# Patient Record
Sex: Male | Born: 1990 | Race: White | Hispanic: No | Marital: Single | Smoking: Former smoker
Health system: Southern US, Community
[De-identification: ages and names within clinical notes are randomized; demographics above are authoritative.]

## PROBLEM LIST (undated history)

## (undated) DIAGNOSIS — J45909 Unspecified asthma, uncomplicated: Secondary | ICD-10-CM

## (undated) DIAGNOSIS — F988 Other specified behavioral and emotional disorders with onset usually occurring in childhood and adolescence: Secondary | ICD-10-CM

## (undated) HISTORY — PX: TONSILLECTOMY: SUR1361

## (undated) HISTORY — DX: Other specified behavioral and emotional disorders with onset usually occurring in childhood and adolescence: F98.8

## (undated) HISTORY — DX: Unspecified asthma, uncomplicated: J45.909

---
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
## 2009-08-11 HISTORY — PX: WISDOM TOOTH EXTRACTION: SHX21

## 2013-01-05 LAB — CBC AND DIFFERENTIAL
HEMATOCRIT: 43 % (ref 41–53)
Hemoglobin: 14.8 g/dL (ref 13.5–17.5)
Neutrophils Absolute: 2 /uL
Platelets: 187 10*3/uL (ref 150–399)
WBC: 4.5 10^3/mL

## 2013-01-05 LAB — HEPATIC FUNCTION PANEL
ALT: 26 U/L (ref 10–40)
AST: 19 U/L (ref 14–40)
Alkaline Phosphatase: 38 U/L (ref 25–125)
BILIRUBIN, TOTAL: 0.6 mg/dL

## 2013-01-05 LAB — BASIC METABOLIC PANEL
BUN: 18 mg/dL (ref 4–21)
Creatinine: 1 mg/dL (ref 0.6–1.3)
Glucose: 94 mg/dL
POTASSIUM: 5 mmol/L (ref 3.4–5.3)
Sodium: 141 mmol/L (ref 137–147)

## 2013-01-05 LAB — LIPID PANEL
Cholesterol: 155 mg/dL (ref 0–200)
HDL: 45 mg/dL (ref 35–70)
LDL Cholesterol: 98 mg/dL
LDl/HDL Ratio: 2.2
Triglycerides: 61 mg/dL (ref 40–160)

## 2013-12-22 ENCOUNTER — Ambulatory Visit: Payer: Self-pay | Admitting: Otolaryngology

## 2014-07-04 ENCOUNTER — Ambulatory Visit: Payer: Self-pay | Admitting: Family Medicine

## 2014-07-04 ENCOUNTER — Observation Stay: Payer: Self-pay | Admitting: General Surgery

## 2014-07-04 DIAGNOSIS — K3580 Unspecified acute appendicitis: Secondary | ICD-10-CM

## 2014-07-04 HISTORY — PX: APPENDECTOMY: SHX54

## 2014-07-04 LAB — CBC WITH DIFFERENTIAL/PLATELET
BASOS ABS: 0 10*3/uL (ref 0.0–0.1)
Basophil %: 0.4 %
EOS PCT: 3.6 %
Eosinophil #: 0.3 10*3/uL (ref 0.0–0.7)
HCT: 43.2 % (ref 40.0–52.0)
HGB: 14.6 g/dL (ref 13.0–18.0)
LYMPHS ABS: 2.3 10*3/uL (ref 1.0–3.6)
LYMPHS PCT: 31.1 %
MCH: 30.4 pg (ref 26.0–34.0)
MCHC: 33.7 g/dL (ref 32.0–36.0)
MCV: 90 fL (ref 80–100)
MONO ABS: 0.6 x10 3/mm (ref 0.2–1.0)
MONOS PCT: 7.8 %
NEUTROS ABS: 4.3 10*3/uL (ref 1.4–6.5)
NEUTROS PCT: 57.1 %
Platelet: 171 10*3/uL (ref 150–440)
RBC: 4.79 10*6/uL (ref 4.40–5.90)
RDW: 12.6 % (ref 11.5–14.5)
WBC: 7.5 10*3/uL (ref 3.8–10.6)

## 2014-07-05 ENCOUNTER — Encounter: Payer: Self-pay | Admitting: General Surgery

## 2014-07-05 LAB — CBC WITH DIFFERENTIAL/PLATELET
BASOS ABS: 0 10*3/uL (ref 0.0–0.1)
Basophil %: 0.2 %
EOS ABS: 0.1 10*3/uL (ref 0.0–0.7)
Eosinophil %: 0.9 %
HCT: 40 % (ref 40.0–52.0)
HGB: 13.1 g/dL (ref 13.0–18.0)
LYMPHS PCT: 10.5 %
Lymphocyte #: 1.5 10*3/uL (ref 1.0–3.6)
MCH: 30 pg (ref 26.0–34.0)
MCHC: 32.8 g/dL (ref 32.0–36.0)
MCV: 92 fL (ref 80–100)
MONO ABS: 1.1 x10 3/mm — AB (ref 0.2–1.0)
Monocyte %: 7.9 %
NEUTROS ABS: 11.8 10*3/uL — AB (ref 1.4–6.5)
NEUTROS PCT: 80.5 %
PLATELETS: 154 10*3/uL (ref 150–440)
RBC: 4.37 10*6/uL — ABNORMAL LOW (ref 4.40–5.90)
RDW: 12.4 % (ref 11.5–14.5)
WBC: 14.6 10*3/uL — ABNORMAL HIGH (ref 3.8–10.6)

## 2014-07-10 ENCOUNTER — Encounter: Payer: Self-pay | Admitting: General Surgery

## 2014-07-18 ENCOUNTER — Ambulatory Visit: Payer: Managed Care, Other (non HMO) | Admitting: General Surgery

## 2014-07-20 ENCOUNTER — Ambulatory Visit (INDEPENDENT_AMBULATORY_CARE_PROVIDER_SITE_OTHER): Payer: Self-pay | Admitting: General Surgery

## 2014-07-20 ENCOUNTER — Ambulatory Visit: Payer: Managed Care, Other (non HMO) | Admitting: General Surgery

## 2014-07-20 ENCOUNTER — Encounter: Payer: Self-pay | Admitting: General Surgery

## 2014-07-20 VITALS — BP 140/80 | HR 90 | Resp 16 | Ht 70.0 in | Wt 203.0 lb

## 2014-07-20 DIAGNOSIS — K3580 Unspecified acute appendicitis: Secondary | ICD-10-CM

## 2014-07-20 NOTE — Patient Instructions (Signed)
Gradually increase activity

## 2014-07-20 NOTE — Progress Notes (Signed)
Here today for postoperative viist, appendectomy done 07-05-14. He states he is doing well. Bowels are  regular.   Port sites well healed. Abdomen is soft, non tender and positive bowel sounds. Gradually increase activity. Follow up as needed. Path showed acute appendicitis. Pt advised.

## 2014-12-02 NOTE — Op Note (Signed)
PATIENT NAME:  Nathan Johnston, Nathan Johnston MR#:  [id number] DATE OF BIRTH:  1990/11/19  DATE OF PROCEDURE:  07/05/2014  PREOPERATIVE DIAGNOSIS: Acute appendicitis.   POSTOPERATIVE DIAGNOSIS: Acute appendicitis.   OPERATION: Laparoscopy and appendectomy.   SURGEON: Kathreen CosierS.  G. Juandavid Dallman, MD   ANESTHESIA: General.   COMPLICATIONS: None.   ESTIMATED BLOOD LOSS: Minimal.   DRAINS: None.   PROCEDURE IN DETAIL: The patient was put to sleep in the supine position on the operating table. Foley catheter was inserted, and then removed at the end of the procedure. The initial entry was made at the umbilicus, along the upper lip. The Veress needle with the InnerDyne sleeve was positioned after a small stab incision was made, and positioned in the peritoneal cavity and verified with the hanging drop method. Pneumoperitoneum was obtained and a 10 mm port was placed. The camera was then introduced. A suprapubic 5 mm port in the left lower quadrant and 12 mm ports were placed. The appendix was lying just above the right lower quadrant area, pointing directly down, and noted to be mildly inflamed at its distal 2 cm. The mesoappendix was identified, freed close to the appendix, and taken down with the white load of an Endo GIA. It was then noted that the patient had somewhat of a bulbous appendiceal base, containing what looked like a lipoma-like, soft mass, at this site. It was, therefore, decided to include this portion of the cecal wall adjacent to the appendix, and therefore a longer GIA was used and the blue load was applied. this portion was stapled across and cut. The appendix was removed with the retrieval bag through the left lower quadrant port site. The staple line was inspected. Two tiny oozing spots were cauterized. A small amount of fluid was used to irrigate out the right lower abdomen and the pelvic area, and suctioned out. A small serosal tear was noted on the bowel, adjacent to this, from the suction, in order to  ensure that this would not cause a leak, and this was lifted up and a smaller GIA stapled across to close this. After ensuring hemostasis, the 2 fascial openings in the left lower quadrant and the umbilicus respectively, were closed with the use of 0 Vicryl placed with suture passers, pneumoperitoneum was released and the remaining port removed. The skin incisions were closed with subcuticular 4-0 Vicryl, covered with LiquiBand.   The procedure was well tolerated. He was subsequently extubated and returned to the recovery room in stable condition.    ____________________________ S.Wynona LunaG. Kalijah Zeiss, MD sgs:MT D: 07/05/2014 10:53:41 ET T: 07/05/2014 12:32:03 ET JOB#: [id number]  cc: S.G. Evette CristalSankar, MD, <Dictator> [facility] Wynona LunaG Sherrol Vicars MD ELECTRONICALLY SIGNED 07/05/2014 19:28

## 2014-12-02 NOTE — H&P (Signed)
Subjective/Chief Complaint abdominal pain   History of Present Illness Healthy 24 yr old male started having some pain in right lower abdomen 2 days ago. It has persisted and he decided to seek attention today.  No n/v, bowel change, urinary symptoms or fever.   Past History Tnsillectomy,  ADD   Past Med/Surgical Hx:  denies medical:   Tonsillectomy:   ALLERGIES:  No Known Allergies:   Family and Social History:  Family History Non-Contributory   Review of Systems:  Fever/Chills No   Abdominal Pain Yes  right lower abdomen   Constipation No   Nausea/Vomiting No   Dysuria No   Physical Exam:  GEN well developed, well nourished, no acute distress   HEENT pink conjunctivae   NECK supple  No masses   RESP normal resp effort  clear BS   CARD regular rate  no murmur   ABD positive tenderness  soft  normal BS  modertae rlq tenderness with mild rebound tenderness.   LYMPH negative neck   SKIN No rashes   PSYCH A+O to time, place, person   Radiology Results: CT:    24-Nov-15 18:49, CT Abdomen and Pelvis With Contrast  CT Abdomen and Pelvis With Contrast  REASON FOR EXAM:    CALL REPORT [phone] RLQ Pain and tenderness   possible appendicitis HOLD ...  COMMENTS:       PROCEDURE: CT  - CT ABDOMEN / PELVIS  W  - Jul 04 2014  6:49PM     CLINICAL DATA:  Right lower quadrant pain for 48 hours. No nausea,  vomiting or diarrhea. Fever.    EXAM:  CT ABDOMEN AND PELVIS WITH CONTRAST    TECHNIQUE:  Multidetector CT imaging of the abdomen and pelvis was performed  using the standard protocol following bolus administration of  intravenous contrast.    CONTRAST:  100 mL Isovue 370    COMPARISON:  None.    FINDINGS:  The lung bases are clear.    The liver demonstrates no focal abnormality. There is no  intrahepatic or extrahepatic biliary ductal dilatation. The  gallbladder is normal. The spleen demonstrates no focal abnormality.  The kidneys, adrenal  glands and pancreas are normal. The bladder is  unremarkable.  The stomach, duodenum, small intestine, and large intestine  demonstrate no contrast extravasation or dilatation. The appendix is  dilatedmeasuring 11 mm in diameter with periappendiceal  inflammatory changes and an afferent the colon at the base of the  appendix. There is no pneumoperitoneum, pneumatosis, or portal  venous gas. There is no abdominal or pelvic free fluid. There is no  lymphadenopathy.    The abdominal aorta is normal in caliber .    There are no lytic or sclerotic osseous lesions.     IMPRESSION:  1. Findings consistent with acute appendicitis. No evidence of  appendiceal perforation or periappendiceal abscess.  These results were called by telephone at the time of interpretation  on 07/04/2014 at 7:01 pm to Dr. Julieanne Manson , who verbally  acknowledged these results.      Electronically Signed    By: Elige Ko    On: 07/04/2014 19:01         Verified UJ:[id number] Leodis Binet, M.D., MD    Assessment/Admission Diagnosis CT reviewed. Dialted appendix with stranding consistent with acute appendicitis.   Plan Recommended laparoscopy, appendectiomy. Reasons, procedure, risks and benfits explained and he is agreeable.   Electronic Signatures: Kieth Brightly (MD)  (Signed [phone]  19:56)  Authored: CHIEF COMPLAINT and HISTORY, PAST MEDICAL/SURGIAL HISTORY, ALLERGIES, FAMILY AND SOCIAL HISTORY, REVIEW OF SYSTEMS, PHYSICAL EXAM, Radiology, ASSESSMENT AND PLAN   Last Updated: 24-Nov-15 19:56 by Kieth BrightlySankar, Seeplaputhur G (MD)

## 2014-12-04 LAB — SURGICAL PATHOLOGY

## 2015-02-17 DIAGNOSIS — F988 Other specified behavioral and emotional disorders with onset usually occurring in childhood and adolescence: Secondary | ICD-10-CM | POA: Insufficient documentation

## 2015-02-17 DIAGNOSIS — E663 Overweight: Secondary | ICD-10-CM | POA: Insufficient documentation

## 2015-02-17 DIAGNOSIS — J45909 Unspecified asthma, uncomplicated: Secondary | ICD-10-CM | POA: Insufficient documentation

## 2015-02-19 ENCOUNTER — Ambulatory Visit (INDEPENDENT_AMBULATORY_CARE_PROVIDER_SITE_OTHER): Payer: Managed Care, Other (non HMO) | Admitting: Family Medicine

## 2015-02-19 ENCOUNTER — Encounter: Payer: Self-pay | Admitting: Family Medicine

## 2015-02-19 VITALS — BP 124/78 | HR 76 | Temp 98.2°F | Resp 16 | Ht 70.0 in | Wt 218.0 lb

## 2015-02-19 DIAGNOSIS — F909 Attention-deficit hyperactivity disorder, unspecified type: Secondary | ICD-10-CM

## 2015-02-19 DIAGNOSIS — Z111 Encounter for screening for respiratory tuberculosis: Secondary | ICD-10-CM

## 2015-02-19 DIAGNOSIS — F988 Other specified behavioral and emotional disorders with onset usually occurring in childhood and adolescence: Secondary | ICD-10-CM

## 2015-02-19 MED ORDER — METHYLPHENIDATE HCL ER (LA) 40 MG PO CP24: 40.0000 mg | ORAL_CAPSULE | ORAL | Status: DC

## 2015-02-19 NOTE — Progress Notes (Signed)
Patient ID: Nathan Johnston, male   DOB: 1991-02-14, 24 y.o.   MRN: [id number]   Nathan Johnston  MRN: [id number] DOB: [birth year]  Subjective:  HPI   1. ADD (attention deficit disorder) Patient presents today for follow up of his ADD.  He was last seen on 11/27/14.  No management changes were made at that time.  He reports good compliance and good tolerance of the medications.  He is currently taking Concerta 54 mg daily and then Ritalin 5 mg as needed for days that he has late hours.  2. 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  It may require him to have a TB screening done.   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 He has finished his math masters and starts his  new job this month.    Patient Active Problem List   Diagnosis Date Noted  . ADD (attention deficit disorder) 02/17/2015  . Asthma 02/17/2015  . Overweight 02/17/2015    Past Medical History  Diagnosis Date  . ADD (attention deficit disorder)   . Asthma   . ADD (attention deficit disorder)     History   Social History  . Marital Status: Single    Spouse Name: N/A  . Number of Children: N/A  . Years of Education: N/A   Occupational History  . Not on file.   Social History Main Topics  . Smoking status: Former Smoker -- 0.25 packs/day for 2 years    Types: Cigarettes    Quit date: 08/10/2013  . Smokeless tobacco: Never Used  . Alcohol Use: 0.0 oz/week    0 Standard drinks or equivalent per week     Comment: 2-3 beers per week  . Drug Use: No  . Sexual Activity:    Partners: Male   Other Topics Concern  . Not on file   Social History Narrative    Outpatient Prescriptions Prior to Visit  Medication Sig Dispense Refill  . acetaminophen (TYLENOL) 500 MG tablet Take 500 mg by mouth every 6 (six) hours as  needed.    . Ascorbic Acid (VITAMIN C) 100 MG tablet Take 100 mg by mouth daily.    . B Complex Vitamins (B-COMPLEX/B-12 PO) Take by mouth daily.     . fluticasone (FLONASE) 50 MCG/ACT nasal spray Place 2 sprays into both nostrils daily.   12  . methylphenidate (RITALIN) 5 MG tablet Take 5 mg by mouth daily as needed.    . METHYLPHENIDATE 54 MG PO CR tablet Take 54 mg by mouth every morning.     . Multiple Vitamin (MULTIVITAMIN) capsule Take 1 capsule by mouth daily.    . vitamin E 100 UNIT capsule Take by mouth daily.     No facility-administered medications prior to visit.    No Known Allergies  Review of Systems  Constitutional: Negative.   Respiratory: Negative.   Cardiovascular: Negative.   Neurological: Negative.   Psychiatric/Behavioral: Negative.    Objective:  BP 124/78 mmHg  Pulse 76  Temp(Src) 98.2 F (36.8 C) (Oral)  Resp 16  Ht  (1.778 m)  Wt 218 lb (98.884 kg)  BMI 31.28 kg/m2  Physical Exam  Constitutional: He is oriented to person, place, and time and well-developed, well-nourished, and in no distress.  HENT:  Head: Normocephalic and atraumatic.  Right Ear: External ear normal.  Left Ear: External ear normal.  Nose: Nose normal.  Mouth/Throat: Oropharynx is clear and moist.  Eyes: Conjunctivae and EOM are normal. Pupils are equal, round, and reactive to light.  Cardiovascular: Normal rate, regular rhythm and normal heart sounds.   Pulmonary/Chest: Effort normal and breath sounds normal.  Abdominal: Soft. Bowel sounds are normal.  Musculoskeletal: Normal range of motion.  Neurological: He is alert and oriented to person, place, and time. Gait normal.  Skin: Skin is warm and dry.  Psychiatric: Memory, affect and judgment normal.    Assessment and Plan :   1. ADD (attention deficit disorder) Patient is stable on present medications will continue the same.  He reports he is only in need of the refill on his Concerta. Brand-name Concerta is not  covered so will do extended release Ritalin at the highest dose as his matches the dose of the Concerta. He will [place] new [place]. I have given them in the neck line group there [place] that of known for many many years. - methylphenidate (RITALIN LA) 40 MG 24 hr capsule; Take 1 capsule (40 mg total) by mouth every morning.  Dispense: 30 capsule; Refill: 0 - methylphenidate (RITALIN LA) 40 MG 24 hr capsule; Take 1 capsule (40 mg total) by mouth every morning.  Dispense: 30 capsule; Refill: 0 - methylphenidate (RITALIN LA) 40 MG 24 hr capsule; Take 1 capsule (40 mg total) by mouth every morning.  Dispense: 30 capsule; Refill: 0  2. Screening-pulmonary TB  Patient may need TB screening test done for his new employment.  Will provide order for test in the event he needs this.  - Quantiferon tb gold assay 3. 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 Julieanne Mansonichard Gilbert MD [facility] 02/19/2015 4:12 PM

## 2015-02-21 ENCOUNTER — Telehealth: Payer: Self-pay | Admitting: Family Medicine

## 2015-02-21 NOTE — Telephone Encounter (Signed)
LMTCB ED 

## 2015-02-21 NOTE — Telephone Encounter (Signed)
Pt's father have questions about his concerta.  Please call him back.

## 2015-02-22 ENCOUNTER — Other Ambulatory Visit: Payer: Self-pay

## 2015-02-22 MED ORDER — METHYLPHENIDATE HCL ER (OSM) 54 MG PO TBCR: 54.0000 mg | EXTENDED_RELEASE_TABLET | ORAL | Status: DC

## 2015-02-22 MED ORDER — METHYLPHENIDATE HCL ER (OSM) 54 MG PO TBCR: 54.0000 mg | EXTENDED_RELEASE_TABLET | ORAL | Status: AC

## 2015-02-22 NOTE — Telephone Encounter (Signed)
Please call him at work number.

## 2015-02-22 NOTE — Telephone Encounter (Signed)
LMtcb for father of patient Renae Fickleaul at [phone]

## 2015-02-23 NOTE — Telephone Encounter (Signed)
Rx has been changed  ED

## 2015-05-22 ENCOUNTER — Telehealth: Payer: Self-pay | Admitting: Family Medicine

## 2015-05-22 NOTE — Telephone Encounter (Signed)
Any suggestions for below?

## 2015-05-22 NOTE — Telephone Encounter (Signed)
Large FP group used to be there--[place] are some of the names--good doctors.

## 2015-05-22 NOTE — Telephone Encounter (Signed)
LMTCB

## 2015-05-22 NOTE — Telephone Encounter (Signed)
Pt states he has just moved to [place], [place] and is requesting a referral for a new PCP if Dr Sullivan Lone can suggest a new doctor in the [place] area.  [phone]

## 2015-05-22 NOTE — Telephone Encounter (Signed)
Pt states he has just moved to [place], [place] and is asking if Dr Sullivan Lone can suggest a PCP doctor in the [place] area.  ON#[phone]

## 2015-05-23 NOTE — Telephone Encounter (Signed)
Pt advised/MW °

## 2015-06-01 ENCOUNTER — Telehealth: Payer: Self-pay

## 2015-06-01 ENCOUNTER — Other Ambulatory Visit: Payer: Self-pay | Admitting: Family Medicine

## 2015-06-01 MED ORDER — METHYLPHENIDATE HCL ER (OSM) 54 MG PO TBCR: 54.0000 mg | EXTENDED_RELEASE_TABLET | ORAL | Status: DC

## 2015-06-01 MED ORDER — METHYLPHENIDATE HCL ER (OSM) 54 MG PO TBCR: 54.0000 mg | EXTENDED_RELEASE_TABLET | ORAL | Status: AC

## 2015-06-01 NOTE — Telephone Encounter (Signed)
[place] and is trying to get set up with new PCP but will run out of concerta before he gets an appointment, Can he get refill on this for 2 months,. This is dr. Elisabeth CaraGilbert's patient please review-aa

## 2015-06-06 ENCOUNTER — Other Ambulatory Visit: Payer: Self-pay | Admitting: Family Medicine

## 2015-06-06 MED ORDER — METHYLPHENIDATE HCL ER (OSM) 54 MG PO TBCR: 54.0000 mg | EXTENDED_RELEASE_TABLET | ORAL | Status: DC

## 2015-06-06 MED ORDER — METHYLPHENIDATE HCL ER (OSM) 54 MG PO TBCR: 54.0000 mg | EXTENDED_RELEASE_TABLET | ORAL | Status: AC

## 2015-09-11 IMAGING — CT CT ABD-PELV W/ CM
2 of 4 series · 16 of 46 positions shown, 18 images · IV contrast (isovue)
Comparison: None.

CLINICAL DATA: Right lower quadrant pain for 48 hours. No nausea,
vomiting or diarrhea. Fever.

EXAM:
CT ABDOMEN AND PELVIS WITH CONTRAST
TECHNIQUE: Multidetector CT imaging of the abdomen and pelvis was performed
using the standard protocol following bolus administration of
intravenous contrast.
CONTRAST:  100 mL Isovue 370

[Series 2: routine abd pel with · axial · 0.77mm/px · z∈[-941,-486]mm · 13 of 99 slices shown, 15 images]
[im 4/99  soft-tissue]
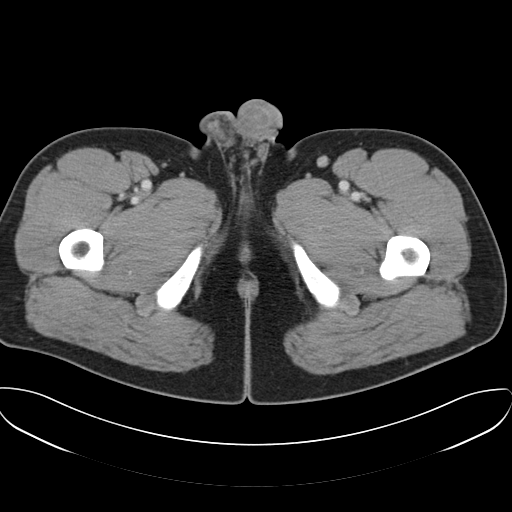
[im 4/99  bone]
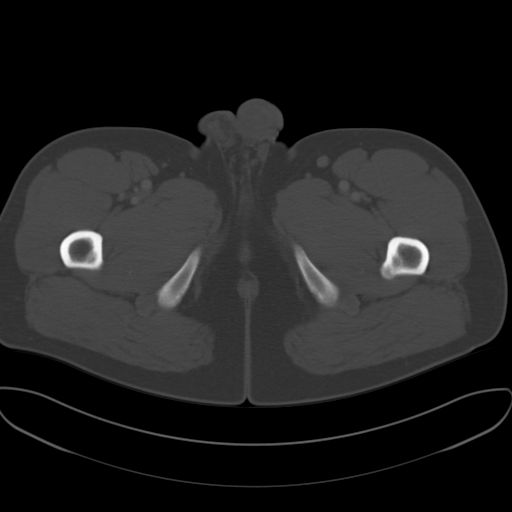
[im 12/99  soft-tissue]
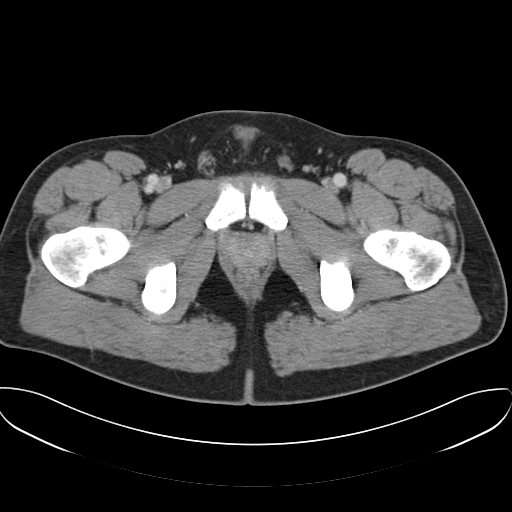
[im 20/99  soft-tissue]
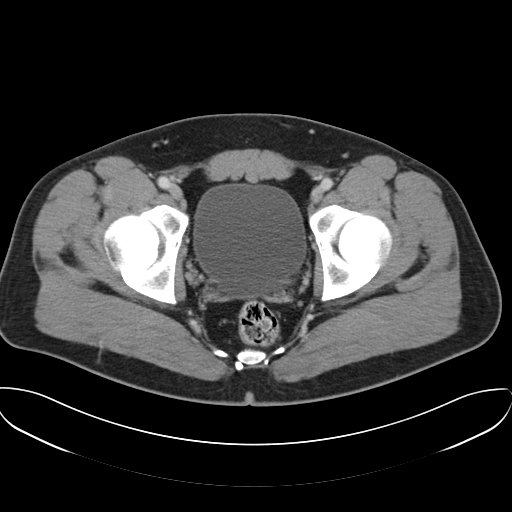
[im 28/99  soft-tissue]
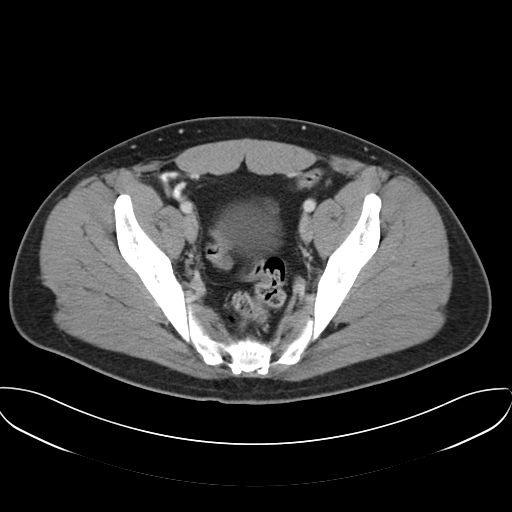
[im 36/99  soft-tissue]
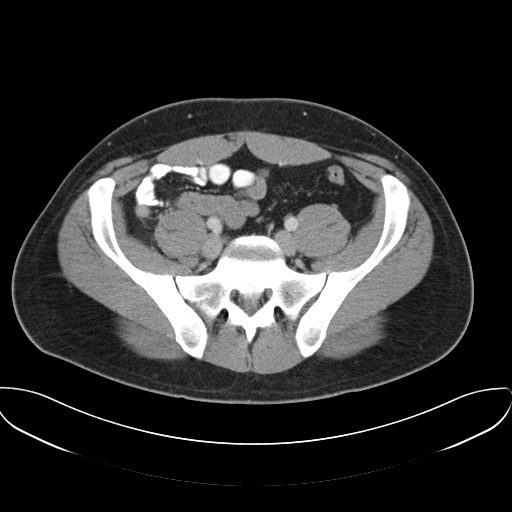
[im 44/99  soft-tissue]
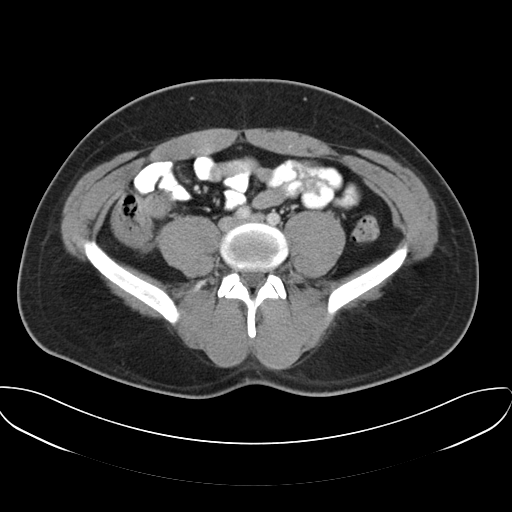
[im 51/99  soft-tissue]
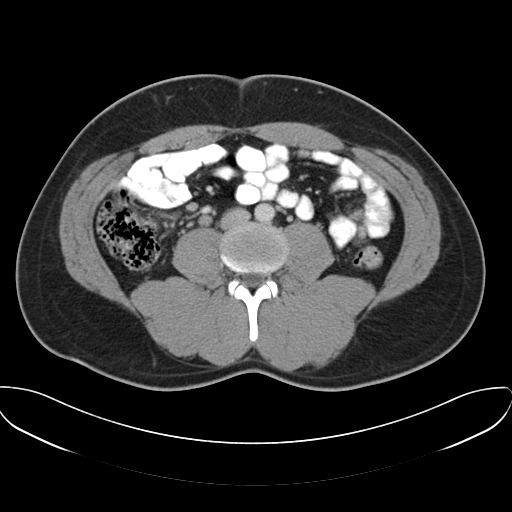
[im 55/99  soft-tissue]
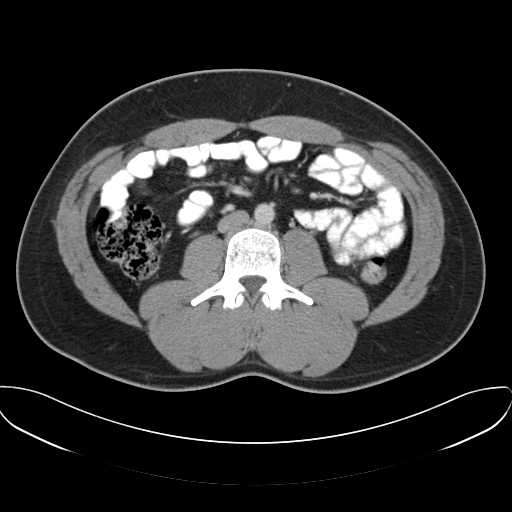
[im 63/99  soft-tissue]
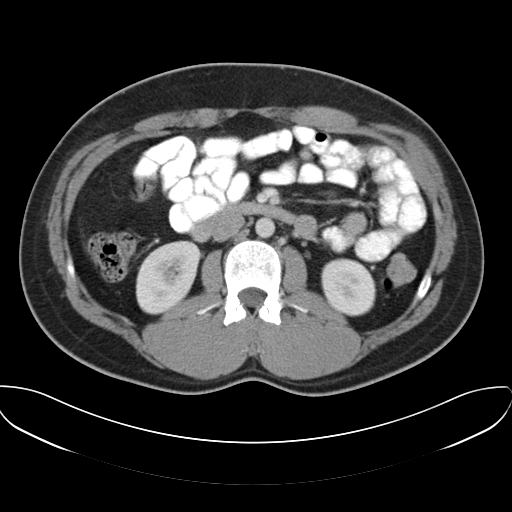
[im 63/99  bone]
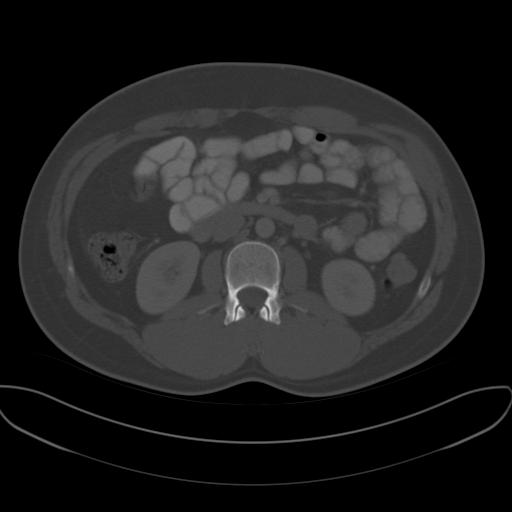
[im 71/99  soft-tissue]
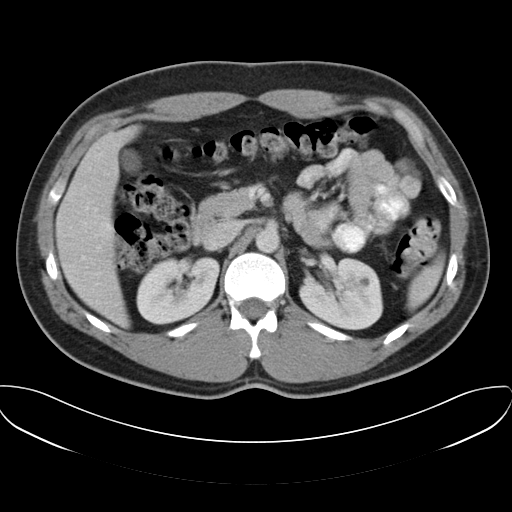
[im 79/99  soft-tissue]
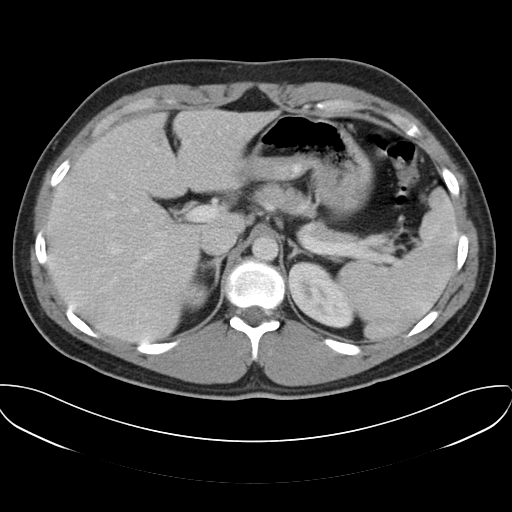
[im 87/99  soft-tissue]
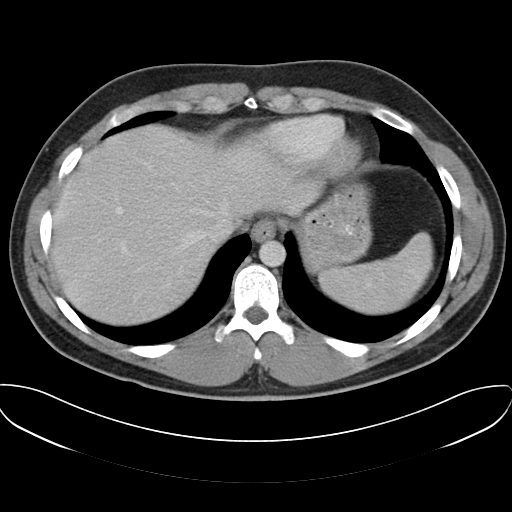
[im 95/99  soft-tissue]
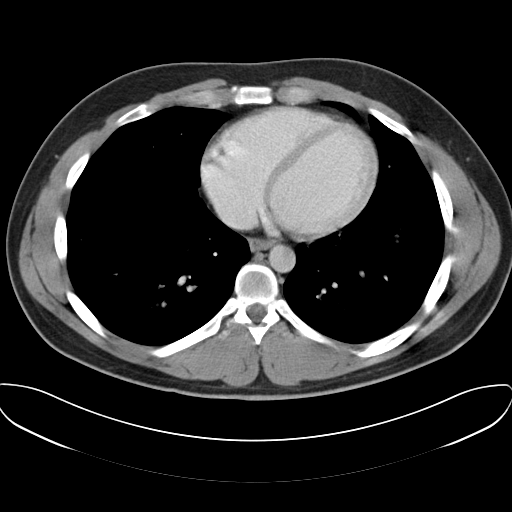

[Series 5: cor routine abd pel with · coronal · 0.78mm/px · 3 of 123 slices shown]
[im 41/123  soft-tissue]
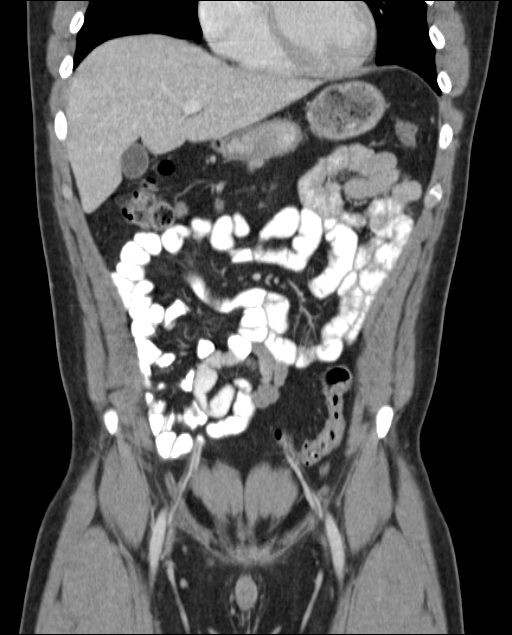
[im 55/123  soft-tissue]
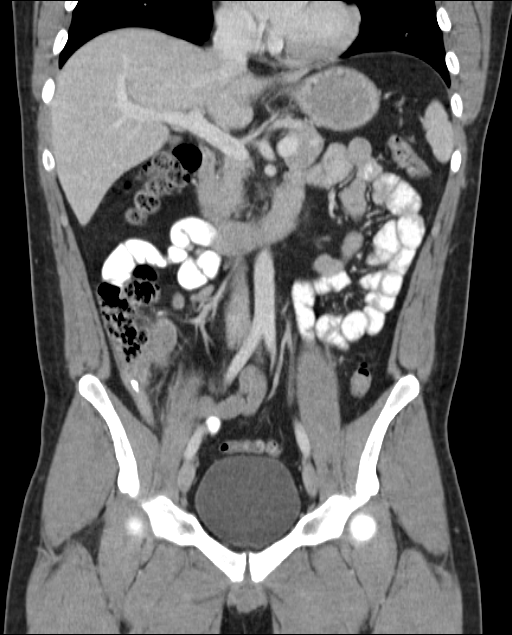
[im 68/123  soft-tissue]
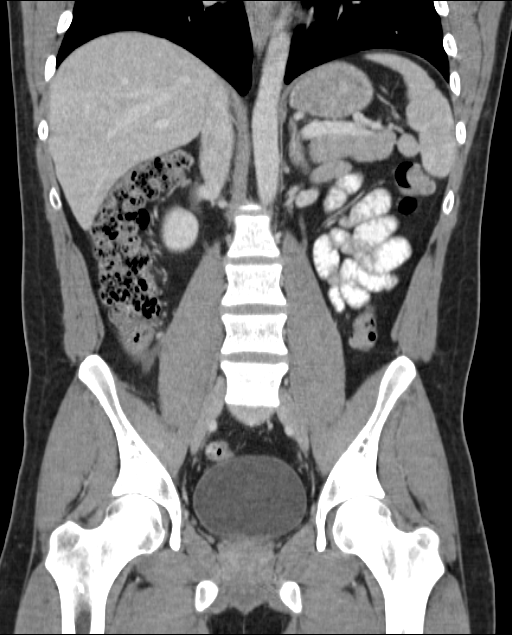

[16 of 46 positions shown; findings below may reference images not displayed]

FINDINGS: The lung bases are clear.

The liver demonstrates no focal abnormality. There is no
intrahepatic or extrahepatic biliary ductal dilatation. The
gallbladder is normal. The spleen demonstrates no focal abnormality.
The kidneys, adrenal glands and pancreas are normal. The bladder is
unremarkable.

The stomach, duodenum, small intestine, and large intestine
demonstrate no contrast extravasation or dilatation. The appendix is
dilated measuring 11 mm in diameter with periappendiceal
inflammatory changes and an afferent the colon at the base of the
appendix. There is no pneumoperitoneum, pneumatosis, or portal
venous gas. There is no abdominal or pelvic free fluid. There is no
lymphadenopathy.

The abdominal aorta is normal in caliber .

There are no lytic or sclerotic osseous lesions.
IMPRESSION: 1. Findings consistent with acute appendicitis. No evidence of
appendiceal perforation or periappendiceal abscess.
These results were called by telephone at the time of interpretation
on 07/04/2014 at [DATE] to Dr. JUAN ANTONIO GAYE , who verbally
acknowledged these results.
